# Patient Record
Sex: Male | Born: 1945 | Race: Black or African American | Hispanic: No | Marital: Married
Health system: Southern US, Community
[De-identification: ages and names within clinical notes are randomized; demographics above are authoritative.]

---

## 2011-02-10 DIAGNOSIS — C61 Malignant neoplasm of prostate: Secondary | ICD-10-CM | POA: Insufficient documentation

## 2016-03-17 DIAGNOSIS — H40033 Anatomical narrow angle, bilateral: Secondary | ICD-10-CM | POA: Diagnosis not present

## 2016-03-17 DIAGNOSIS — H2513 Age-related nuclear cataract, bilateral: Secondary | ICD-10-CM | POA: Diagnosis not present

## 2016-03-29 DIAGNOSIS — H25812 Combined forms of age-related cataract, left eye: Secondary | ICD-10-CM | POA: Diagnosis not present

## 2016-03-29 DIAGNOSIS — H25811 Combined forms of age-related cataract, right eye: Secondary | ICD-10-CM | POA: Diagnosis not present

## 2016-03-29 DIAGNOSIS — H353110 Nonexudative age-related macular degeneration, right eye, stage unspecified: Secondary | ICD-10-CM | POA: Diagnosis not present

## 2016-04-04 DIAGNOSIS — H25811 Combined forms of age-related cataract, right eye: Secondary | ICD-10-CM | POA: Diagnosis not present

## 2016-04-04 DIAGNOSIS — H2511 Age-related nuclear cataract, right eye: Secondary | ICD-10-CM | POA: Diagnosis not present

## 2016-04-21 DIAGNOSIS — H25812 Combined forms of age-related cataract, left eye: Secondary | ICD-10-CM | POA: Diagnosis not present

## 2016-04-21 DIAGNOSIS — H2512 Age-related nuclear cataract, left eye: Secondary | ICD-10-CM | POA: Diagnosis not present

## 2016-09-15 DIAGNOSIS — M79671 Pain in right foot: Secondary | ICD-10-CM | POA: Diagnosis not present

## 2016-09-15 DIAGNOSIS — M2021 Hallux rigidus, right foot: Secondary | ICD-10-CM | POA: Diagnosis not present

## 2016-12-29 DIAGNOSIS — M9903 Segmental and somatic dysfunction of lumbar region: Secondary | ICD-10-CM | POA: Diagnosis not present

## 2016-12-29 DIAGNOSIS — S338XXA Sprain of other parts of lumbar spine and pelvis, initial encounter: Secondary | ICD-10-CM | POA: Diagnosis not present

## 2016-12-29 DIAGNOSIS — S8392XA Sprain of unspecified site of left knee, initial encounter: Secondary | ICD-10-CM | POA: Diagnosis not present

## 2017-01-03 DIAGNOSIS — S338XXA Sprain of other parts of lumbar spine and pelvis, initial encounter: Secondary | ICD-10-CM | POA: Diagnosis not present

## 2017-01-03 DIAGNOSIS — S8392XA Sprain of unspecified site of left knee, initial encounter: Secondary | ICD-10-CM | POA: Diagnosis not present

## 2017-01-03 DIAGNOSIS — M9903 Segmental and somatic dysfunction of lumbar region: Secondary | ICD-10-CM | POA: Diagnosis not present

## 2017-01-10 DIAGNOSIS — S338XXA Sprain of other parts of lumbar spine and pelvis, initial encounter: Secondary | ICD-10-CM | POA: Diagnosis not present

## 2017-01-10 DIAGNOSIS — S8392XA Sprain of unspecified site of left knee, initial encounter: Secondary | ICD-10-CM | POA: Diagnosis not present

## 2017-01-10 DIAGNOSIS — M9903 Segmental and somatic dysfunction of lumbar region: Secondary | ICD-10-CM | POA: Diagnosis not present

## 2017-01-17 DIAGNOSIS — S8392XA Sprain of unspecified site of left knee, initial encounter: Secondary | ICD-10-CM | POA: Diagnosis not present

## 2017-01-17 DIAGNOSIS — S338XXA Sprain of other parts of lumbar spine and pelvis, initial encounter: Secondary | ICD-10-CM | POA: Diagnosis not present

## 2017-01-17 DIAGNOSIS — M9903 Segmental and somatic dysfunction of lumbar region: Secondary | ICD-10-CM | POA: Diagnosis not present

## 2017-01-26 DIAGNOSIS — S8392XA Sprain of unspecified site of left knee, initial encounter: Secondary | ICD-10-CM | POA: Diagnosis not present

## 2017-01-26 DIAGNOSIS — M9903 Segmental and somatic dysfunction of lumbar region: Secondary | ICD-10-CM | POA: Diagnosis not present

## 2017-01-26 DIAGNOSIS — S338XXA Sprain of other parts of lumbar spine and pelvis, initial encounter: Secondary | ICD-10-CM | POA: Diagnosis not present

## 2017-02-09 DIAGNOSIS — S8392XA Sprain of unspecified site of left knee, initial encounter: Secondary | ICD-10-CM | POA: Diagnosis not present

## 2017-02-09 DIAGNOSIS — S338XXA Sprain of other parts of lumbar spine and pelvis, initial encounter: Secondary | ICD-10-CM | POA: Diagnosis not present

## 2017-02-09 DIAGNOSIS — M9903 Segmental and somatic dysfunction of lumbar region: Secondary | ICD-10-CM | POA: Diagnosis not present

## 2017-02-23 DIAGNOSIS — S338XXA Sprain of other parts of lumbar spine and pelvis, initial encounter: Secondary | ICD-10-CM | POA: Diagnosis not present

## 2017-02-23 DIAGNOSIS — M9903 Segmental and somatic dysfunction of lumbar region: Secondary | ICD-10-CM | POA: Diagnosis not present

## 2017-02-23 DIAGNOSIS — S8392XA Sprain of unspecified site of left knee, initial encounter: Secondary | ICD-10-CM | POA: Diagnosis not present

## 2017-12-12 DIAGNOSIS — M79675 Pain in left toe(s): Secondary | ICD-10-CM | POA: Diagnosis not present

## 2017-12-12 DIAGNOSIS — M1000 Idiopathic gout, unspecified site: Secondary | ICD-10-CM | POA: Diagnosis not present

## 2018-01-23 DIAGNOSIS — E1165 Type 2 diabetes mellitus with hyperglycemia: Secondary | ICD-10-CM | POA: Diagnosis not present

## 2018-01-23 DIAGNOSIS — J40 Bronchitis, not specified as acute or chronic: Secondary | ICD-10-CM | POA: Diagnosis not present

## 2018-01-23 DIAGNOSIS — J0100 Acute maxillary sinusitis, unspecified: Secondary | ICD-10-CM | POA: Diagnosis not present

## 2018-01-24 DIAGNOSIS — J0100 Acute maxillary sinusitis, unspecified: Secondary | ICD-10-CM | POA: Diagnosis not present

## 2018-01-24 DIAGNOSIS — I10 Essential (primary) hypertension: Secondary | ICD-10-CM | POA: Diagnosis not present

## 2018-01-24 DIAGNOSIS — D351 Benign neoplasm of parathyroid gland: Secondary | ICD-10-CM | POA: Diagnosis not present

## 2018-01-24 DIAGNOSIS — Z87891 Personal history of nicotine dependence: Secondary | ICD-10-CM | POA: Diagnosis not present

## 2018-01-24 DIAGNOSIS — J40 Bronchitis, not specified as acute or chronic: Secondary | ICD-10-CM | POA: Diagnosis not present

## 2018-01-24 DIAGNOSIS — Z7982 Long term (current) use of aspirin: Secondary | ICD-10-CM | POA: Diagnosis not present

## 2018-01-24 DIAGNOSIS — Z23 Encounter for immunization: Secondary | ICD-10-CM | POA: Diagnosis not present

## 2018-01-24 DIAGNOSIS — E1165 Type 2 diabetes mellitus with hyperglycemia: Secondary | ICD-10-CM | POA: Diagnosis not present

## 2018-01-24 DIAGNOSIS — E785 Hyperlipidemia, unspecified: Secondary | ICD-10-CM | POA: Diagnosis not present

## 2018-01-24 DIAGNOSIS — Z79899 Other long term (current) drug therapy: Secondary | ICD-10-CM | POA: Diagnosis not present

## 2018-01-24 DIAGNOSIS — Z7984 Long term (current) use of oral hypoglycemic drugs: Secondary | ICD-10-CM | POA: Diagnosis not present

## 2018-02-01 DIAGNOSIS — Z6830 Body mass index (BMI) 30.0-30.9, adult: Secondary | ICD-10-CM | POA: Diagnosis not present

## 2018-02-01 DIAGNOSIS — E785 Hyperlipidemia, unspecified: Secondary | ICD-10-CM | POA: Diagnosis not present

## 2018-02-01 DIAGNOSIS — E119 Type 2 diabetes mellitus without complications: Secondary | ICD-10-CM | POA: Diagnosis not present

## 2018-02-01 DIAGNOSIS — I10 Essential (primary) hypertension: Secondary | ICD-10-CM | POA: Diagnosis not present

## 2018-02-02 ENCOUNTER — Other Ambulatory Visit: Payer: Self-pay

## 2018-02-02 DIAGNOSIS — Z6830 Body mass index (BMI) 30.0-30.9, adult: Secondary | ICD-10-CM | POA: Diagnosis not present

## 2018-02-02 DIAGNOSIS — I10 Essential (primary) hypertension: Secondary | ICD-10-CM | POA: Diagnosis not present

## 2018-02-02 DIAGNOSIS — E119 Type 2 diabetes mellitus without complications: Secondary | ICD-10-CM | POA: Diagnosis not present

## 2018-02-02 DIAGNOSIS — E785 Hyperlipidemia, unspecified: Secondary | ICD-10-CM | POA: Diagnosis not present

## 2018-02-02 NOTE — Patient Outreach (Signed)
[facility]) Care Management  02/02/2018  Kevin Rivas [birth year] [id number]  Transition of care  Referral date: 02/02/18 Referral source: discharged from an inpatient admission from [facility] on 01/26/18. Insurance: [employer]  Transition of care will be completed by patient's primary care provider office who will refer to [facility] if needed.   PLAN: RNCM will close patient due to patient being enrolled in an external program.   Quinn Plowman RN,BSN,CCM [facility] Telephonic  [phone]

## 2018-02-15 DIAGNOSIS — Z0000 Encounter for general adult medical examination without abnormal findings: Secondary | ICD-10-CM | POA: Diagnosis not present

## 2018-02-15 DIAGNOSIS — Z6830 Body mass index (BMI) 30.0-30.9, adult: Secondary | ICD-10-CM | POA: Diagnosis not present

## 2018-02-19 DIAGNOSIS — E041 Nontoxic single thyroid nodule: Secondary | ICD-10-CM | POA: Diagnosis not present

## 2018-02-23 DIAGNOSIS — E210 Primary hyperparathyroidism: Secondary | ICD-10-CM | POA: Diagnosis not present

## 2018-02-23 DIAGNOSIS — E213 Hyperparathyroidism, unspecified: Secondary | ICD-10-CM | POA: Diagnosis not present

## 2018-03-06 DIAGNOSIS — E1165 Type 2 diabetes mellitus with hyperglycemia: Secondary | ICD-10-CM | POA: Insufficient documentation

## 2018-03-06 DIAGNOSIS — E119 Type 2 diabetes mellitus without complications: Secondary | ICD-10-CM | POA: Diagnosis not present

## 2018-03-06 DIAGNOSIS — R000 Tachycardia, unspecified: Secondary | ICD-10-CM | POA: Diagnosis not present

## 2018-03-06 DIAGNOSIS — E210 Primary hyperparathyroidism: Secondary | ICD-10-CM | POA: Diagnosis not present

## 2018-03-06 DIAGNOSIS — Z794 Long term (current) use of insulin: Secondary | ICD-10-CM | POA: Diagnosis not present

## 2018-03-12 DIAGNOSIS — E210 Primary hyperparathyroidism: Secondary | ICD-10-CM | POA: Diagnosis not present

## 2018-03-12 DIAGNOSIS — E212 Other hyperparathyroidism: Secondary | ICD-10-CM | POA: Diagnosis not present

## 2018-03-12 DIAGNOSIS — E213 Hyperparathyroidism, unspecified: Secondary | ICD-10-CM | POA: Diagnosis not present

## 2018-03-30 DIAGNOSIS — E210 Primary hyperparathyroidism: Secondary | ICD-10-CM | POA: Diagnosis not present

## 2018-05-17 DIAGNOSIS — E785 Hyperlipidemia, unspecified: Secondary | ICD-10-CM | POA: Diagnosis not present

## 2018-05-17 DIAGNOSIS — R634 Abnormal weight loss: Secondary | ICD-10-CM | POA: Diagnosis not present

## 2018-05-17 DIAGNOSIS — H6690 Otitis media, unspecified, unspecified ear: Secondary | ICD-10-CM | POA: Diagnosis not present

## 2018-05-17 DIAGNOSIS — B029 Zoster without complications: Secondary | ICD-10-CM | POA: Diagnosis not present

## 2018-05-17 DIAGNOSIS — E782 Mixed hyperlipidemia: Secondary | ICD-10-CM | POA: Diagnosis not present

## 2018-05-17 DIAGNOSIS — Z09 Encounter for follow-up examination after completed treatment for conditions other than malignant neoplasm: Secondary | ICD-10-CM | POA: Diagnosis not present

## 2018-05-17 DIAGNOSIS — I10 Essential (primary) hypertension: Secondary | ICD-10-CM | POA: Diagnosis not present

## 2018-05-17 DIAGNOSIS — R7309 Other abnormal glucose: Secondary | ICD-10-CM | POA: Diagnosis not present

## 2018-05-17 DIAGNOSIS — Z1389 Encounter for screening for other disorder: Secondary | ICD-10-CM | POA: Diagnosis not present

## 2018-05-17 DIAGNOSIS — Z6831 Body mass index (BMI) 31.0-31.9, adult: Secondary | ICD-10-CM | POA: Diagnosis not present

## 2018-05-17 DIAGNOSIS — L720 Epidermal cyst: Secondary | ICD-10-CM | POA: Diagnosis not present

## 2018-05-17 DIAGNOSIS — E1121 Type 2 diabetes mellitus with diabetic nephropathy: Secondary | ICD-10-CM | POA: Diagnosis not present

## 2018-05-17 DIAGNOSIS — J302 Other seasonal allergic rhinitis: Secondary | ICD-10-CM | POA: Diagnosis not present

## 2018-05-17 DIAGNOSIS — I2510 Atherosclerotic heart disease of native coronary artery without angina pectoris: Secondary | ICD-10-CM | POA: Diagnosis not present

## 2018-05-17 DIAGNOSIS — Z85828 Personal history of other malignant neoplasm of skin: Secondary | ICD-10-CM | POA: Diagnosis not present

## 2018-05-17 DIAGNOSIS — Z6832 Body mass index (BMI) 32.0-32.9, adult: Secondary | ICD-10-CM | POA: Diagnosis not present

## 2018-05-17 DIAGNOSIS — L820 Inflamed seborrheic keratosis: Secondary | ICD-10-CM | POA: Diagnosis not present

## 2018-05-17 DIAGNOSIS — Z0000 Encounter for general adult medical examination without abnormal findings: Secondary | ICD-10-CM | POA: Diagnosis not present

## 2018-05-17 DIAGNOSIS — E559 Vitamin D deficiency, unspecified: Secondary | ICD-10-CM | POA: Diagnosis not present

## 2018-05-17 DIAGNOSIS — L821 Other seborrheic keratosis: Secondary | ICD-10-CM | POA: Diagnosis not present

## 2018-05-17 DIAGNOSIS — R52 Pain, unspecified: Secondary | ICD-10-CM | POA: Diagnosis not present

## 2018-05-17 DIAGNOSIS — E063 Autoimmune thyroiditis: Secondary | ICD-10-CM | POA: Diagnosis not present

## 2018-05-17 DIAGNOSIS — Z23 Encounter for immunization: Secondary | ICD-10-CM | POA: Diagnosis not present

## 2018-05-17 DIAGNOSIS — L738 Other specified follicular disorders: Secondary | ICD-10-CM | POA: Diagnosis not present

## 2018-05-17 DIAGNOSIS — Z6830 Body mass index (BMI) 30.0-30.9, adult: Secondary | ICD-10-CM | POA: Diagnosis not present

## 2018-05-17 DIAGNOSIS — M5136 Other intervertebral disc degeneration, lumbar region: Secondary | ICD-10-CM | POA: Diagnosis not present

## 2018-05-17 DIAGNOSIS — N182 Chronic kidney disease, stage 2 (mild): Secondary | ICD-10-CM | POA: Diagnosis not present

## 2018-06-04 DIAGNOSIS — M9903 Segmental and somatic dysfunction of lumbar region: Secondary | ICD-10-CM | POA: Diagnosis not present

## 2018-06-04 DIAGNOSIS — S8392XA Sprain of unspecified site of left knee, initial encounter: Secondary | ICD-10-CM | POA: Diagnosis not present

## 2018-06-04 DIAGNOSIS — S338XXA Sprain of other parts of lumbar spine and pelvis, initial encounter: Secondary | ICD-10-CM | POA: Diagnosis not present

## 2018-06-05 DIAGNOSIS — S338XXA Sprain of other parts of lumbar spine and pelvis, initial encounter: Secondary | ICD-10-CM | POA: Diagnosis not present

## 2018-06-05 DIAGNOSIS — S8392XA Sprain of unspecified site of left knee, initial encounter: Secondary | ICD-10-CM | POA: Diagnosis not present

## 2018-06-05 DIAGNOSIS — M9903 Segmental and somatic dysfunction of lumbar region: Secondary | ICD-10-CM | POA: Diagnosis not present

## 2018-07-07 DIAGNOSIS — D649 Anemia, unspecified: Secondary | ICD-10-CM | POA: Diagnosis not present

## 2018-07-07 DIAGNOSIS — E1165 Type 2 diabetes mellitus with hyperglycemia: Secondary | ICD-10-CM | POA: Diagnosis not present

## 2018-07-07 DIAGNOSIS — K7689 Other specified diseases of liver: Secondary | ICD-10-CM | POA: Diagnosis not present

## 2018-07-07 DIAGNOSIS — E871 Hypo-osmolality and hyponatremia: Secondary | ICD-10-CM | POA: Diagnosis not present

## 2018-07-07 DIAGNOSIS — E785 Hyperlipidemia, unspecified: Secondary | ICD-10-CM | POA: Diagnosis not present

## 2018-07-07 DIAGNOSIS — R42 Dizziness and giddiness: Secondary | ICD-10-CM | POA: Diagnosis not present

## 2018-07-07 DIAGNOSIS — E876 Hypokalemia: Secondary | ICD-10-CM | POA: Diagnosis not present

## 2018-07-07 DIAGNOSIS — Z87891 Personal history of nicotine dependence: Secondary | ICD-10-CM | POA: Diagnosis not present

## 2018-07-07 DIAGNOSIS — R61 Generalized hyperhidrosis: Secondary | ICD-10-CM | POA: Diagnosis not present

## 2018-07-07 DIAGNOSIS — R531 Weakness: Secondary | ICD-10-CM | POA: Diagnosis not present

## 2018-07-07 DIAGNOSIS — K8689 Other specified diseases of pancreas: Secondary | ICD-10-CM | POA: Diagnosis not present

## 2018-07-07 DIAGNOSIS — R110 Nausea: Secondary | ICD-10-CM | POA: Diagnosis not present

## 2018-07-07 DIAGNOSIS — Z7982 Long term (current) use of aspirin: Secondary | ICD-10-CM | POA: Diagnosis not present

## 2018-07-07 DIAGNOSIS — I10 Essential (primary) hypertension: Secondary | ICD-10-CM | POA: Diagnosis not present

## 2018-07-07 DIAGNOSIS — K869 Disease of pancreas, unspecified: Secondary | ICD-10-CM | POA: Diagnosis not present

## 2018-07-07 DIAGNOSIS — N2889 Other specified disorders of kidney and ureter: Secondary | ICD-10-CM | POA: Diagnosis not present

## 2018-07-07 DIAGNOSIS — Z794 Long term (current) use of insulin: Secondary | ICD-10-CM | POA: Diagnosis not present

## 2018-07-07 DIAGNOSIS — K838 Other specified diseases of biliary tract: Secondary | ICD-10-CM | POA: Diagnosis not present

## 2018-07-07 DIAGNOSIS — Z923 Personal history of irradiation: Secondary | ICD-10-CM | POA: Diagnosis not present

## 2018-07-11 DIAGNOSIS — E1165 Type 2 diabetes mellitus with hyperglycemia: Secondary | ICD-10-CM | POA: Diagnosis not present

## 2018-07-11 DIAGNOSIS — K838 Other specified diseases of biliary tract: Secondary | ICD-10-CM | POA: Diagnosis not present

## 2018-07-11 DIAGNOSIS — C250 Malignant neoplasm of head of pancreas: Secondary | ICD-10-CM | POA: Diagnosis not present

## 2018-07-11 DIAGNOSIS — Z7982 Long term (current) use of aspirin: Secondary | ICD-10-CM | POA: Diagnosis not present

## 2018-07-11 DIAGNOSIS — Z20828 Contact with and (suspected) exposure to other viral communicable diseases: Secondary | ICD-10-CM | POA: Diagnosis not present

## 2018-07-11 DIAGNOSIS — M109 Gout, unspecified: Secondary | ICD-10-CM | POA: Diagnosis not present

## 2018-07-11 DIAGNOSIS — D649 Anemia, unspecified: Secondary | ICD-10-CM | POA: Diagnosis not present

## 2018-07-11 DIAGNOSIS — E871 Hypo-osmolality and hyponatremia: Secondary | ICD-10-CM | POA: Diagnosis not present

## 2018-07-11 DIAGNOSIS — Z451 Encounter for adjustment and management of infusion pump: Secondary | ICD-10-CM | POA: Diagnosis not present

## 2018-07-11 DIAGNOSIS — E876 Hypokalemia: Secondary | ICD-10-CM | POA: Diagnosis not present

## 2018-07-11 DIAGNOSIS — K8689 Other specified diseases of pancreas: Secondary | ICD-10-CM | POA: Insufficient documentation

## 2018-07-11 DIAGNOSIS — E119 Type 2 diabetes mellitus without complications: Secondary | ICD-10-CM | POA: Diagnosis not present

## 2018-07-11 DIAGNOSIS — D490 Neoplasm of unspecified behavior of digestive system: Secondary | ICD-10-CM | POA: Diagnosis not present

## 2018-07-11 DIAGNOSIS — R17 Unspecified jaundice: Secondary | ICD-10-CM | POA: Diagnosis not present

## 2018-07-11 DIAGNOSIS — E213 Hyperparathyroidism, unspecified: Secondary | ICD-10-CM | POA: Diagnosis not present

## 2018-07-11 DIAGNOSIS — I509 Heart failure, unspecified: Secondary | ICD-10-CM | POA: Diagnosis not present

## 2018-07-11 DIAGNOSIS — Z79899 Other long term (current) drug therapy: Secondary | ICD-10-CM | POA: Diagnosis not present

## 2018-07-11 DIAGNOSIS — K869 Disease of pancreas, unspecified: Secondary | ICD-10-CM | POA: Diagnosis not present

## 2018-07-11 DIAGNOSIS — K269 Duodenal ulcer, unspecified as acute or chronic, without hemorrhage or perforation: Secondary | ICD-10-CM | POA: Diagnosis not present

## 2018-07-11 DIAGNOSIS — Z794 Long term (current) use of insulin: Secondary | ICD-10-CM | POA: Diagnosis not present

## 2018-07-11 DIAGNOSIS — M6250 Muscle wasting and atrophy, not elsewhere classified, unspecified site: Secondary | ICD-10-CM | POA: Diagnosis not present

## 2018-07-11 DIAGNOSIS — C259 Malignant neoplasm of pancreas, unspecified: Secondary | ICD-10-CM | POA: Diagnosis not present

## 2018-07-11 DIAGNOSIS — Z8546 Personal history of malignant neoplasm of prostate: Secondary | ICD-10-CM | POA: Diagnosis not present

## 2018-07-11 DIAGNOSIS — I10 Essential (primary) hypertension: Secondary | ICD-10-CM | POA: Diagnosis not present

## 2018-07-11 DIAGNOSIS — Z87891 Personal history of nicotine dependence: Secondary | ICD-10-CM | POA: Diagnosis not present

## 2018-07-11 DIAGNOSIS — K831 Obstruction of bile duct: Secondary | ICD-10-CM | POA: Diagnosis not present

## 2018-07-23 DIAGNOSIS — Z6827 Body mass index (BMI) 27.0-27.9, adult: Secondary | ICD-10-CM | POA: Diagnosis not present

## 2018-07-23 DIAGNOSIS — D017 Carcinoma in situ of other specified digestive organs: Secondary | ICD-10-CM | POA: Diagnosis not present

## 2018-07-25 DIAGNOSIS — C259 Malignant neoplasm of pancreas, unspecified: Secondary | ICD-10-CM | POA: Diagnosis not present

## 2018-07-25 DIAGNOSIS — C772 Secondary and unspecified malignant neoplasm of intra-abdominal lymph nodes: Secondary | ICD-10-CM | POA: Diagnosis not present

## 2018-07-25 DIAGNOSIS — K8689 Other specified diseases of pancreas: Secondary | ICD-10-CM | POA: Diagnosis not present

## 2018-07-25 DIAGNOSIS — C250 Malignant neoplasm of head of pancreas: Secondary | ICD-10-CM | POA: Diagnosis not present

## 2018-07-25 DIAGNOSIS — C61 Malignant neoplasm of prostate: Secondary | ICD-10-CM | POA: Diagnosis not present

## 2018-07-26 ENCOUNTER — Other Ambulatory Visit: Payer: Self-pay | Admitting: Oncology

## 2018-07-26 ENCOUNTER — Other Ambulatory Visit (HOSPITAL_COMMUNITY): Payer: Self-pay | Admitting: Oncology

## 2018-07-26 DIAGNOSIS — C250 Malignant neoplasm of head of pancreas: Secondary | ICD-10-CM

## 2018-07-31 ENCOUNTER — Ambulatory Visit (HOSPITAL_COMMUNITY): Payer: PPO

## 2018-08-01 DIAGNOSIS — C259 Malignant neoplasm of pancreas, unspecified: Secondary | ICD-10-CM | POA: Insufficient documentation

## 2018-08-03 ENCOUNTER — Encounter (HOSPITAL_COMMUNITY)
Admission: RE | Admit: 2018-08-03 | Discharge: 2018-08-03 | Disposition: A | Discharge status: Home / Self Care | Payer: PPO | Source: Ambulatory Visit | Attending: Oncology | Admitting: Oncology

## 2018-08-03 ENCOUNTER — Encounter (HOSPITAL_COMMUNITY): Payer: PPO

## 2018-08-03 ENCOUNTER — Other Ambulatory Visit: Payer: Self-pay

## 2018-08-03 DIAGNOSIS — C250 Malignant neoplasm of head of pancreas: Secondary | ICD-10-CM | POA: Diagnosis not present

## 2018-08-03 DIAGNOSIS — J320 Chronic maxillary sinusitis: Secondary | ICD-10-CM | POA: Diagnosis not present

## 2018-08-03 DIAGNOSIS — C259 Malignant neoplasm of pancreas, unspecified: Secondary | ICD-10-CM | POA: Diagnosis not present

## 2018-08-03 LAB — GLUCOSE, CAPILLARY: Glucose-Capillary: 178 mg/dL — ABNORMAL HIGH (ref 70–99)

## 2018-08-03 IMAGING — PT NUCLEAR MEDICINE PET IMAGE INITIAL (PI) SKULL BASE TO THIGH
1 series · 8 of 8 positions shown · non-contrast
Comparison: CT abdomen 07/07/2018

CLINICAL DATA: Initial treatment strategy for pancreatic cancer.

EXAM:
NUCLEAR MEDICINE PET SKULL BASE TO THIGH
TECHNIQUE: 9.9 mCi F-18 FDG was injected intravenously. Full-ring PET imaging
was performed from the skull base to thigh after the radiotracer. CT
data was obtained and used for attenuation correction and anatomic
localization.
Fasting blood glucose: 178 mg/dl

[Series 1077: results mm oncology reading · 5.0mm · 0.75mm/px · 8 of 8 slices shown]
[im 1/8]
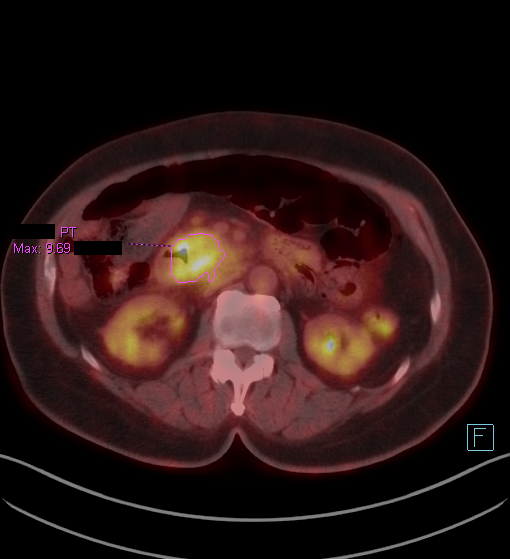
[im 2/8]
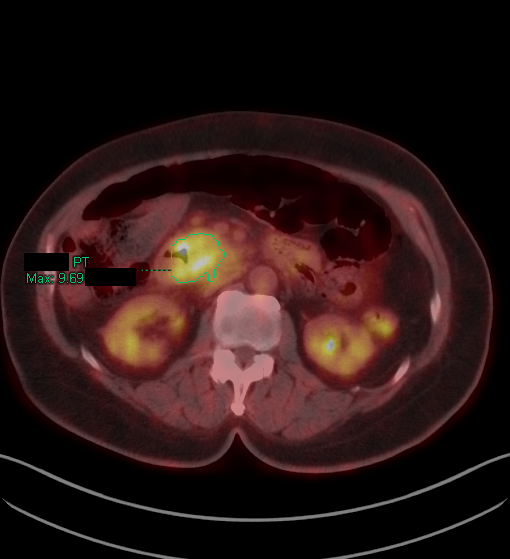
[im 3/8]
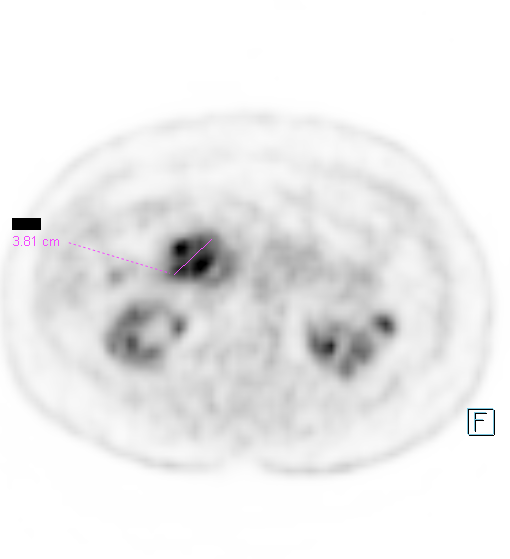
[im 4/8]
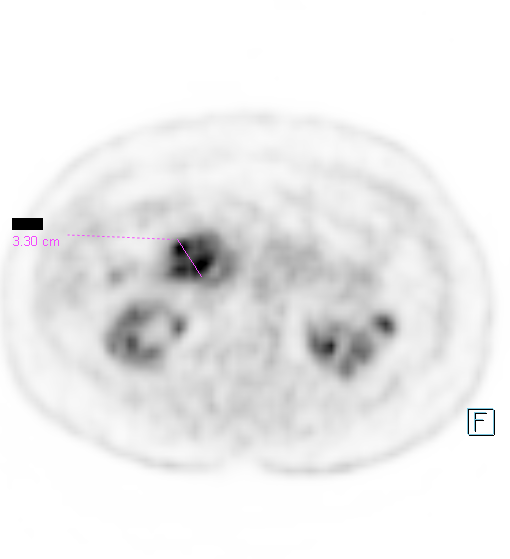
[im 5/8]
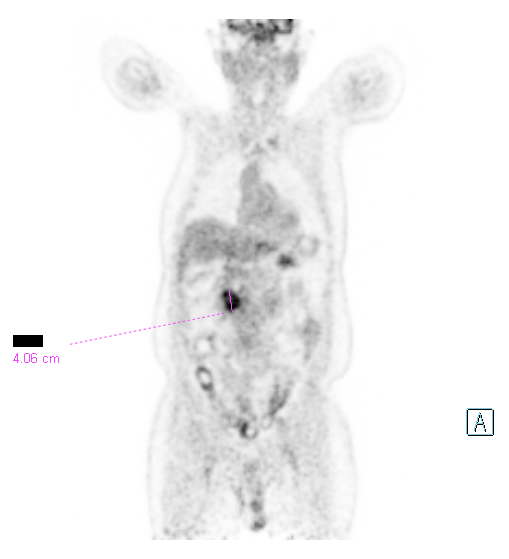
[im 6/8]
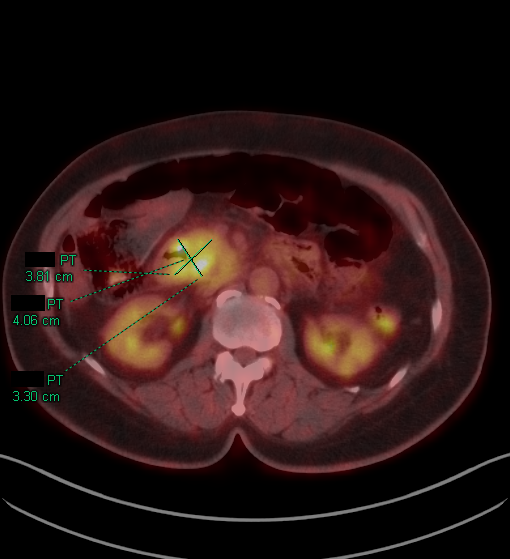
[im 7/8]
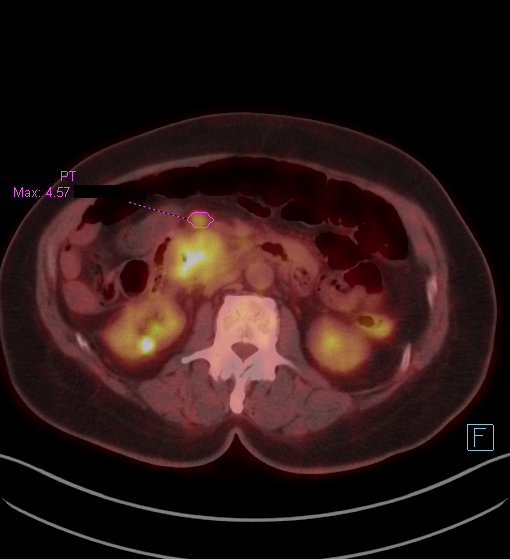
[im 8/8]
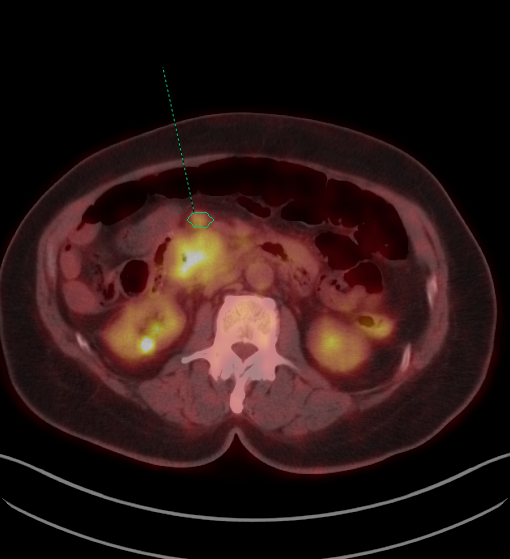

[8 of 8 positions shown; findings below may reference images not displayed]

FINDINGS: Mediastinal blood pool activity: SUV max

Liver activity: SUV max NA

NECK: No significant abnormal hypermetabolic activity in this
region.

Incidental CT findings: Chronic left maxillary sinusitis. Partial
right thyroidectomy.

CHEST: No significant abnormal hypermetabolic activity in this
region.

Incidental CT findings: none

ABDOMEN/PELVIS: Pancreatic head mass with maximum SUV 9.7,
compatible with malignancy. Associated hypermetabolic activity
measures 4.1 by 3.8 by 3.3 cm. A peripancreatic lymph node with
short axis diameter 1.3 cm just anterior to the mass on image 136/4
has a maximum SUV of 4.6, favoring malignant involvement. The mass
has somewhat indistinct borders, and there is prominent pancreatic
duct dilatation extending into the vicinity of the mass

Accentuated metabolic activity in various loops of bowel, primarily
colon, multifocal in segmental, most likely physiologic given the
lack of correlating CT findings. No hepatic metastatic lesions are
identified.

Incidental CT findings: There is a stent passing from the gastric
lumen through the left hepatic lobe and terminating in a pigtail in
the porta hepatis, probably a hepatico-gastric stent, correlate with
recent procedural interventions. The biliary dilatation is markedly
improved.

A stable 0.9 by 0.8 cm hypodense lesion in the liver in the right
hepatic lobe on image 110/4 is not hypermetabolic and appear stable.

Mild peripancreatic stranding, a degree of pancreatitis is difficult
to exclude.

Prior prostatectomy and pelvic lymph node dissection. There is wall
thickening in the rectum which may be from nondistention but which
is technically nonspecific.

SKELETON: No significant abnormal hypermetabolic activity in this
region.

Incidental CT findings: Bridging spurring of the sacroiliac joints.
Degenerative right facet arthropathy at L5-S1. Asymmetric
degenerative glenohumeral arthropathy, right greater than left.
IMPRESSION: 1. Hypermetabolic pancreatic head mass, maximum SUV 9.7, measuring
up to 4.1 cm in long axis. Marked dilatation of the dorsal
pancreatic duct extending to this mass. At least 1 adjacent
hypermetabolic lymph node anterior to the mass, maximum SUV 4.6. g
2. No distant metastatic spread is identified.
3. Hepatico-gastric stent with resulting interval decompression of
the biliary tree.
4. Other imaging findings of potential clinical significance:
Chronic left maxillary sinusitis. Prior prostatectomy. Wall
thickening of the rectum, possibly from nondistention but
technically nonspecific, without accentuated metabolic activity.

## 2018-08-03 MED ORDER — FLUDEOXYGLUCOSE F - 18 (FDG) INJECTION
9.9000 | Freq: Once | INTRAVENOUS | Status: AC | PRN
  Administered 2018-08-03: 9.9 via INTRAVENOUS

## 2018-08-07 DIAGNOSIS — C259 Malignant neoplasm of pancreas, unspecified: Secondary | ICD-10-CM | POA: Diagnosis not present

## 2018-08-07 DIAGNOSIS — D649 Anemia, unspecified: Secondary | ICD-10-CM | POA: Diagnosis not present

## 2018-08-07 DIAGNOSIS — C61 Malignant neoplasm of prostate: Secondary | ICD-10-CM | POA: Diagnosis not present

## 2018-08-07 DIAGNOSIS — C772 Secondary and unspecified malignant neoplasm of intra-abdominal lymph nodes: Secondary | ICD-10-CM | POA: Diagnosis not present

## 2018-08-07 DIAGNOSIS — R17 Unspecified jaundice: Secondary | ICD-10-CM | POA: Diagnosis not present

## 2018-08-13 DIAGNOSIS — Z794 Long term (current) use of insulin: Secondary | ICD-10-CM | POA: Diagnosis not present

## 2018-08-13 DIAGNOSIS — Z1159 Encounter for screening for other viral diseases: Secondary | ICD-10-CM | POA: Diagnosis not present

## 2018-08-13 DIAGNOSIS — E876 Hypokalemia: Secondary | ICD-10-CM | POA: Diagnosis not present

## 2018-08-13 DIAGNOSIS — Z79899 Other long term (current) drug therapy: Secondary | ICD-10-CM | POA: Diagnosis not present

## 2018-08-13 DIAGNOSIS — Z7982 Long term (current) use of aspirin: Secondary | ICD-10-CM | POA: Diagnosis not present

## 2018-08-13 DIAGNOSIS — Z8546 Personal history of malignant neoplasm of prostate: Secondary | ICD-10-CM | POA: Diagnosis not present

## 2018-08-13 DIAGNOSIS — Z87891 Personal history of nicotine dependence: Secondary | ICD-10-CM | POA: Diagnosis not present

## 2018-08-13 DIAGNOSIS — E119 Type 2 diabetes mellitus without complications: Secondary | ICD-10-CM | POA: Diagnosis not present

## 2018-08-13 DIAGNOSIS — C772 Secondary and unspecified malignant neoplasm of intra-abdominal lymph nodes: Secondary | ICD-10-CM | POA: Diagnosis not present

## 2018-08-13 DIAGNOSIS — E871 Hypo-osmolality and hyponatremia: Secondary | ICD-10-CM | POA: Diagnosis not present

## 2018-08-13 DIAGNOSIS — C259 Malignant neoplasm of pancreas, unspecified: Secondary | ICD-10-CM | POA: Diagnosis not present

## 2018-08-13 DIAGNOSIS — I10 Essential (primary) hypertension: Secondary | ICD-10-CM | POA: Diagnosis not present

## 2018-08-15 DIAGNOSIS — E876 Hypokalemia: Secondary | ICD-10-CM | POA: Diagnosis not present

## 2018-08-15 DIAGNOSIS — Z87891 Personal history of nicotine dependence: Secondary | ICD-10-CM | POA: Diagnosis not present

## 2018-08-15 DIAGNOSIS — E871 Hypo-osmolality and hyponatremia: Secondary | ICD-10-CM | POA: Diagnosis not present

## 2018-08-15 DIAGNOSIS — C772 Secondary and unspecified malignant neoplasm of intra-abdominal lymph nodes: Secondary | ICD-10-CM | POA: Diagnosis not present

## 2018-08-15 DIAGNOSIS — I10 Essential (primary) hypertension: Secondary | ICD-10-CM | POA: Diagnosis not present

## 2018-08-15 DIAGNOSIS — Z794 Long term (current) use of insulin: Secondary | ICD-10-CM | POA: Diagnosis not present

## 2018-08-15 DIAGNOSIS — E119 Type 2 diabetes mellitus without complications: Secondary | ICD-10-CM | POA: Diagnosis not present

## 2018-08-15 DIAGNOSIS — C259 Malignant neoplasm of pancreas, unspecified: Secondary | ICD-10-CM | POA: Diagnosis not present

## 2018-08-15 DIAGNOSIS — Z79899 Other long term (current) drug therapy: Secondary | ICD-10-CM | POA: Diagnosis not present

## 2018-08-15 DIAGNOSIS — Z7982 Long term (current) use of aspirin: Secondary | ICD-10-CM | POA: Diagnosis not present

## 2018-08-16 DIAGNOSIS — C259 Malignant neoplasm of pancreas, unspecified: Secondary | ICD-10-CM | POA: Diagnosis not present

## 2018-08-16 DIAGNOSIS — E213 Hyperparathyroidism, unspecified: Secondary | ICD-10-CM | POA: Diagnosis not present

## 2018-08-16 DIAGNOSIS — Z7982 Long term (current) use of aspirin: Secondary | ICD-10-CM | POA: Diagnosis not present

## 2018-08-16 DIAGNOSIS — Z87891 Personal history of nicotine dependence: Secondary | ICD-10-CM | POA: Diagnosis not present

## 2018-08-16 DIAGNOSIS — Z794 Long term (current) use of insulin: Secondary | ICD-10-CM | POA: Diagnosis not present

## 2018-08-16 DIAGNOSIS — Z79899 Other long term (current) drug therapy: Secondary | ICD-10-CM | POA: Diagnosis not present

## 2018-08-16 DIAGNOSIS — Z923 Personal history of irradiation: Secondary | ICD-10-CM | POA: Diagnosis not present

## 2018-08-16 DIAGNOSIS — E1165 Type 2 diabetes mellitus with hyperglycemia: Secondary | ICD-10-CM | POA: Diagnosis not present

## 2018-08-16 DIAGNOSIS — E871 Hypo-osmolality and hyponatremia: Secondary | ICD-10-CM | POA: Diagnosis not present

## 2018-08-16 DIAGNOSIS — C772 Secondary and unspecified malignant neoplasm of intra-abdominal lymph nodes: Secondary | ICD-10-CM | POA: Diagnosis not present

## 2018-08-21 DIAGNOSIS — C259 Malignant neoplasm of pancreas, unspecified: Secondary | ICD-10-CM | POA: Diagnosis not present

## 2018-08-21 DIAGNOSIS — C772 Secondary and unspecified malignant neoplasm of intra-abdominal lymph nodes: Secondary | ICD-10-CM | POA: Diagnosis not present

## 2018-08-22 DIAGNOSIS — Z5111 Encounter for antineoplastic chemotherapy: Secondary | ICD-10-CM | POA: Diagnosis not present

## 2018-08-22 DIAGNOSIS — C259 Malignant neoplasm of pancreas, unspecified: Secondary | ICD-10-CM | POA: Diagnosis not present

## 2018-08-22 DIAGNOSIS — C772 Secondary and unspecified malignant neoplasm of intra-abdominal lymph nodes: Secondary | ICD-10-CM | POA: Diagnosis not present

## 2018-08-22 DIAGNOSIS — D649 Anemia, unspecified: Secondary | ICD-10-CM | POA: Diagnosis not present

## 2018-08-24 DIAGNOSIS — C772 Secondary and unspecified malignant neoplasm of intra-abdominal lymph nodes: Secondary | ICD-10-CM | POA: Diagnosis not present

## 2018-08-24 DIAGNOSIS — C259 Malignant neoplasm of pancreas, unspecified: Secondary | ICD-10-CM | POA: Diagnosis not present

## 2018-08-27 DIAGNOSIS — C772 Secondary and unspecified malignant neoplasm of intra-abdominal lymph nodes: Secondary | ICD-10-CM | POA: Diagnosis not present

## 2018-08-27 DIAGNOSIS — C259 Malignant neoplasm of pancreas, unspecified: Secondary | ICD-10-CM | POA: Diagnosis not present

## 2018-09-03 DIAGNOSIS — C772 Secondary and unspecified malignant neoplasm of intra-abdominal lymph nodes: Secondary | ICD-10-CM | POA: Diagnosis not present

## 2018-09-03 DIAGNOSIS — E860 Dehydration: Secondary | ICD-10-CM | POA: Diagnosis not present

## 2018-09-03 DIAGNOSIS — C259 Malignant neoplasm of pancreas, unspecified: Secondary | ICD-10-CM | POA: Diagnosis not present

## 2018-09-03 DIAGNOSIS — D649 Anemia, unspecified: Secondary | ICD-10-CM | POA: Diagnosis not present

## 2018-09-04 DIAGNOSIS — K8689 Other specified diseases of pancreas: Secondary | ICD-10-CM | POA: Diagnosis not present

## 2018-09-04 DIAGNOSIS — C772 Secondary and unspecified malignant neoplasm of intra-abdominal lymph nodes: Secondary | ICD-10-CM | POA: Diagnosis not present

## 2018-09-04 DIAGNOSIS — C259 Malignant neoplasm of pancreas, unspecified: Secondary | ICD-10-CM | POA: Diagnosis not present

## 2018-09-05 DIAGNOSIS — C259 Malignant neoplasm of pancreas, unspecified: Secondary | ICD-10-CM | POA: Diagnosis not present

## 2018-09-05 DIAGNOSIS — C772 Secondary and unspecified malignant neoplasm of intra-abdominal lymph nodes: Secondary | ICD-10-CM | POA: Diagnosis not present

## 2018-09-10 DIAGNOSIS — C772 Secondary and unspecified malignant neoplasm of intra-abdominal lymph nodes: Secondary | ICD-10-CM | POA: Diagnosis not present

## 2018-09-10 DIAGNOSIS — E876 Hypokalemia: Secondary | ICD-10-CM | POA: Diagnosis not present

## 2018-09-10 DIAGNOSIS — E878 Other disorders of electrolyte and fluid balance, not elsewhere classified: Secondary | ICD-10-CM | POA: Diagnosis not present

## 2018-09-10 DIAGNOSIS — D649 Anemia, unspecified: Secondary | ICD-10-CM | POA: Diagnosis not present

## 2018-09-10 DIAGNOSIS — C259 Malignant neoplasm of pancreas, unspecified: Secondary | ICD-10-CM | POA: Diagnosis not present

## 2018-09-10 DIAGNOSIS — E539 Vitamin B deficiency, unspecified: Secondary | ICD-10-CM | POA: Diagnosis not present

## 2018-09-10 DIAGNOSIS — R531 Weakness: Secondary | ICD-10-CM | POA: Diagnosis not present

## 2018-09-11 DIAGNOSIS — C259 Malignant neoplasm of pancreas, unspecified: Secondary | ICD-10-CM | POA: Diagnosis not present

## 2018-09-11 DIAGNOSIS — K8689 Other specified diseases of pancreas: Secondary | ICD-10-CM | POA: Diagnosis not present

## 2018-09-11 DIAGNOSIS — C772 Secondary and unspecified malignant neoplasm of intra-abdominal lymph nodes: Secondary | ICD-10-CM | POA: Diagnosis not present

## 2018-09-12 ENCOUNTER — Other Ambulatory Visit: Payer: Self-pay

## 2018-09-14 DIAGNOSIS — C772 Secondary and unspecified malignant neoplasm of intra-abdominal lymph nodes: Secondary | ICD-10-CM | POA: Diagnosis not present

## 2018-09-14 DIAGNOSIS — C259 Malignant neoplasm of pancreas, unspecified: Secondary | ICD-10-CM | POA: Diagnosis not present

## 2018-09-17 DIAGNOSIS — C772 Secondary and unspecified malignant neoplasm of intra-abdominal lymph nodes: Secondary | ICD-10-CM | POA: Diagnosis not present

## 2018-09-17 DIAGNOSIS — C259 Malignant neoplasm of pancreas, unspecified: Secondary | ICD-10-CM | POA: Diagnosis not present

## 2018-09-18 DIAGNOSIS — T451X5A Adverse effect of antineoplastic and immunosuppressive drugs, initial encounter: Secondary | ICD-10-CM | POA: Diagnosis not present

## 2018-09-18 DIAGNOSIS — C259 Malignant neoplasm of pancreas, unspecified: Secondary | ICD-10-CM | POA: Diagnosis not present

## 2018-09-18 DIAGNOSIS — K521 Toxic gastroenteritis and colitis: Secondary | ICD-10-CM | POA: Diagnosis not present

## 2018-09-18 DIAGNOSIS — C772 Secondary and unspecified malignant neoplasm of intra-abdominal lymph nodes: Secondary | ICD-10-CM | POA: Diagnosis not present

## 2018-09-18 DIAGNOSIS — Z09 Encounter for follow-up examination after completed treatment for conditions other than malignant neoplasm: Secondary | ICD-10-CM | POA: Diagnosis not present

## 2018-09-19 DIAGNOSIS — C772 Secondary and unspecified malignant neoplasm of intra-abdominal lymph nodes: Secondary | ICD-10-CM | POA: Diagnosis not present

## 2018-09-19 DIAGNOSIS — Z5111 Encounter for antineoplastic chemotherapy: Secondary | ICD-10-CM | POA: Diagnosis not present

## 2018-09-19 DIAGNOSIS — C259 Malignant neoplasm of pancreas, unspecified: Secondary | ICD-10-CM | POA: Diagnosis not present

## 2018-09-21 DIAGNOSIS — C259 Malignant neoplasm of pancreas, unspecified: Secondary | ICD-10-CM | POA: Diagnosis not present

## 2018-09-21 DIAGNOSIS — C772 Secondary and unspecified malignant neoplasm of intra-abdominal lymph nodes: Secondary | ICD-10-CM | POA: Diagnosis not present

## 2018-09-25 DIAGNOSIS — T451X5A Adverse effect of antineoplastic and immunosuppressive drugs, initial encounter: Secondary | ICD-10-CM | POA: Diagnosis not present

## 2018-09-25 DIAGNOSIS — C750 Malignant neoplasm of parathyroid gland: Secondary | ICD-10-CM | POA: Diagnosis not present

## 2018-09-25 DIAGNOSIS — C772 Secondary and unspecified malignant neoplasm of intra-abdominal lymph nodes: Secondary | ICD-10-CM | POA: Diagnosis not present

## 2018-09-25 DIAGNOSIS — Z09 Encounter for follow-up examination after completed treatment for conditions other than malignant neoplasm: Secondary | ICD-10-CM | POA: Diagnosis not present

## 2018-09-25 DIAGNOSIS — I10 Essential (primary) hypertension: Secondary | ICD-10-CM | POA: Diagnosis not present

## 2018-09-25 DIAGNOSIS — K521 Toxic gastroenteritis and colitis: Secondary | ICD-10-CM | POA: Diagnosis not present

## 2018-09-25 DIAGNOSIS — Z87891 Personal history of nicotine dependence: Secondary | ICD-10-CM | POA: Diagnosis not present

## 2018-09-25 DIAGNOSIS — Z794 Long term (current) use of insulin: Secondary | ICD-10-CM | POA: Diagnosis not present

## 2018-09-25 DIAGNOSIS — E119 Type 2 diabetes mellitus without complications: Secondary | ICD-10-CM | POA: Diagnosis not present

## 2018-09-25 DIAGNOSIS — E785 Hyperlipidemia, unspecified: Secondary | ICD-10-CM | POA: Diagnosis not present

## 2018-09-25 DIAGNOSIS — Z79899 Other long term (current) drug therapy: Secondary | ICD-10-CM | POA: Diagnosis not present

## 2018-09-25 DIAGNOSIS — C259 Malignant neoplasm of pancreas, unspecified: Secondary | ICD-10-CM | POA: Diagnosis not present

## 2018-09-25 DIAGNOSIS — R531 Weakness: Secondary | ICD-10-CM | POA: Diagnosis not present

## 2018-09-25 DIAGNOSIS — D649 Anemia, unspecified: Secondary | ICD-10-CM | POA: Diagnosis not present

## 2018-09-25 DIAGNOSIS — E860 Dehydration: Secondary | ICD-10-CM | POA: Diagnosis not present

## 2018-09-27 DIAGNOSIS — E46 Unspecified protein-calorie malnutrition: Secondary | ICD-10-CM | POA: Diagnosis not present

## 2018-09-27 DIAGNOSIS — I10 Essential (primary) hypertension: Secondary | ICD-10-CM | POA: Diagnosis not present

## 2018-09-27 DIAGNOSIS — I959 Hypotension, unspecified: Secondary | ICD-10-CM | POA: Diagnosis not present

## 2018-09-27 DIAGNOSIS — E119 Type 2 diabetes mellitus without complications: Secondary | ICD-10-CM | POA: Diagnosis not present

## 2018-09-27 DIAGNOSIS — R410 Disorientation, unspecified: Secondary | ICD-10-CM | POA: Diagnosis not present

## 2018-09-27 DIAGNOSIS — R55 Syncope and collapse: Secondary | ICD-10-CM | POA: Diagnosis not present

## 2018-09-27 DIAGNOSIS — C259 Malignant neoplasm of pancreas, unspecified: Secondary | ICD-10-CM | POA: Diagnosis not present

## 2018-09-27 DIAGNOSIS — D696 Thrombocytopenia, unspecified: Secondary | ICD-10-CM | POA: Diagnosis not present

## 2018-09-27 DIAGNOSIS — R404 Transient alteration of awareness: Secondary | ICD-10-CM | POA: Diagnosis not present

## 2018-09-27 DIAGNOSIS — D701 Agranulocytosis secondary to cancer chemotherapy: Secondary | ICD-10-CM | POA: Diagnosis not present

## 2018-09-27 DIAGNOSIS — D630 Anemia in neoplastic disease: Secondary | ICD-10-CM | POA: Diagnosis not present

## 2018-09-27 DIAGNOSIS — Z7982 Long term (current) use of aspirin: Secondary | ICD-10-CM | POA: Diagnosis not present

## 2018-09-27 DIAGNOSIS — E785 Hyperlipidemia, unspecified: Secondary | ICD-10-CM | POA: Diagnosis not present

## 2018-09-27 DIAGNOSIS — W182XXA Fall in (into) shower or empty bathtub, initial encounter: Secondary | ICD-10-CM | POA: Diagnosis not present

## 2018-09-27 DIAGNOSIS — J9811 Atelectasis: Secondary | ICD-10-CM | POA: Diagnosis not present

## 2018-09-27 DIAGNOSIS — Z794 Long term (current) use of insulin: Secondary | ICD-10-CM | POA: Diagnosis not present

## 2018-09-27 DIAGNOSIS — E871 Hypo-osmolality and hyponatremia: Secondary | ICD-10-CM | POA: Diagnosis not present

## 2018-09-27 DIAGNOSIS — R4020 Unspecified coma: Secondary | ICD-10-CM | POA: Diagnosis not present

## 2018-09-27 DIAGNOSIS — D709 Neutropenia, unspecified: Secondary | ICD-10-CM | POA: Diagnosis not present

## 2018-09-27 DIAGNOSIS — E860 Dehydration: Secondary | ICD-10-CM | POA: Diagnosis not present

## 2018-09-27 DIAGNOSIS — E1165 Type 2 diabetes mellitus with hyperglycemia: Secondary | ICD-10-CM | POA: Diagnosis not present

## 2018-09-28 DIAGNOSIS — C259 Malignant neoplasm of pancreas, unspecified: Secondary | ICD-10-CM | POA: Diagnosis not present

## 2018-09-28 DIAGNOSIS — D696 Thrombocytopenia, unspecified: Secondary | ICD-10-CM | POA: Diagnosis not present

## 2018-09-28 DIAGNOSIS — D701 Agranulocytosis secondary to cancer chemotherapy: Secondary | ICD-10-CM | POA: Diagnosis not present

## 2018-09-28 DIAGNOSIS — E46 Unspecified protein-calorie malnutrition: Secondary | ICD-10-CM | POA: Diagnosis not present

## 2018-09-28 DIAGNOSIS — D630 Anemia in neoplastic disease: Secondary | ICD-10-CM | POA: Diagnosis not present

## 2018-09-29 DIAGNOSIS — D630 Anemia in neoplastic disease: Secondary | ICD-10-CM | POA: Diagnosis not present

## 2018-09-29 DIAGNOSIS — C259 Malignant neoplasm of pancreas, unspecified: Secondary | ICD-10-CM | POA: Diagnosis not present

## 2018-09-29 DIAGNOSIS — D696 Thrombocytopenia, unspecified: Secondary | ICD-10-CM | POA: Diagnosis not present

## 2018-09-29 DIAGNOSIS — D701 Agranulocytosis secondary to cancer chemotherapy: Secondary | ICD-10-CM | POA: Diagnosis not present

## 2018-09-29 DIAGNOSIS — E46 Unspecified protein-calorie malnutrition: Secondary | ICD-10-CM | POA: Diagnosis not present

## 2018-10-01 DIAGNOSIS — C259 Malignant neoplasm of pancreas, unspecified: Secondary | ICD-10-CM | POA: Diagnosis not present

## 2018-10-01 DIAGNOSIS — D701 Agranulocytosis secondary to cancer chemotherapy: Secondary | ICD-10-CM | POA: Diagnosis not present

## 2018-10-01 DIAGNOSIS — D696 Thrombocytopenia, unspecified: Secondary | ICD-10-CM | POA: Diagnosis not present

## 2018-10-01 DIAGNOSIS — E46 Unspecified protein-calorie malnutrition: Secondary | ICD-10-CM | POA: Diagnosis not present

## 2018-10-01 DIAGNOSIS — D630 Anemia in neoplastic disease: Secondary | ICD-10-CM | POA: Diagnosis not present

## 2018-10-05 DIAGNOSIS — J189 Pneumonia, unspecified organism: Secondary | ICD-10-CM | POA: Diagnosis not present

## 2018-10-05 DIAGNOSIS — Z7982 Long term (current) use of aspirin: Secondary | ICD-10-CM | POA: Diagnosis not present

## 2018-10-05 DIAGNOSIS — T451X5A Adverse effect of antineoplastic and immunosuppressive drugs, initial encounter: Secondary | ICD-10-CM | POA: Diagnosis not present

## 2018-10-05 DIAGNOSIS — I10 Essential (primary) hypertension: Secondary | ICD-10-CM | POA: Diagnosis not present

## 2018-10-05 DIAGNOSIS — R918 Other nonspecific abnormal finding of lung field: Secondary | ICD-10-CM | POA: Diagnosis not present

## 2018-10-05 DIAGNOSIS — Z09 Encounter for follow-up examination after completed treatment for conditions other than malignant neoplasm: Secondary | ICD-10-CM | POA: Diagnosis not present

## 2018-10-05 DIAGNOSIS — K521 Toxic gastroenteritis and colitis: Secondary | ICD-10-CM | POA: Diagnosis not present

## 2018-10-05 DIAGNOSIS — E440 Moderate protein-calorie malnutrition: Secondary | ICD-10-CM | POA: Diagnosis not present

## 2018-10-05 DIAGNOSIS — C259 Malignant neoplasm of pancreas, unspecified: Secondary | ICD-10-CM | POA: Diagnosis not present

## 2018-10-05 DIAGNOSIS — I4581 Long QT syndrome: Secondary | ICD-10-CM | POA: Diagnosis not present

## 2018-10-05 DIAGNOSIS — Z6823 Body mass index (BMI) 23.0-23.9, adult: Secondary | ICD-10-CM | POA: Diagnosis not present

## 2018-10-05 DIAGNOSIS — Z452 Encounter for adjustment and management of vascular access device: Secondary | ICD-10-CM | POA: Diagnosis not present

## 2018-10-05 DIAGNOSIS — J181 Lobar pneumonia, unspecified organism: Secondary | ICD-10-CM | POA: Diagnosis not present

## 2018-10-05 DIAGNOSIS — C772 Secondary and unspecified malignant neoplasm of intra-abdominal lymph nodes: Secondary | ICD-10-CM | POA: Diagnosis not present

## 2018-10-05 DIAGNOSIS — Z923 Personal history of irradiation: Secondary | ICD-10-CM | POA: Diagnosis not present

## 2018-10-05 DIAGNOSIS — D649 Anemia, unspecified: Secondary | ICD-10-CM | POA: Diagnosis not present

## 2018-10-05 DIAGNOSIS — E119 Type 2 diabetes mellitus without complications: Secondary | ICD-10-CM | POA: Diagnosis not present

## 2018-10-05 DIAGNOSIS — Z87891 Personal history of nicotine dependence: Secondary | ICD-10-CM | POA: Diagnosis not present

## 2018-10-05 DIAGNOSIS — E860 Dehydration: Secondary | ICD-10-CM | POA: Diagnosis not present

## 2018-10-05 DIAGNOSIS — E876 Hypokalemia: Secondary | ICD-10-CM | POA: Diagnosis not present

## 2018-10-05 DIAGNOSIS — D72829 Elevated white blood cell count, unspecified: Secondary | ICD-10-CM | POA: Diagnosis not present

## 2018-10-05 DIAGNOSIS — Z794 Long term (current) use of insulin: Secondary | ICD-10-CM | POA: Diagnosis not present

## 2018-10-05 DIAGNOSIS — E785 Hyperlipidemia, unspecified: Secondary | ICD-10-CM | POA: Diagnosis not present

## 2018-10-10 ENCOUNTER — Encounter: Payer: Self-pay | Admitting: *Deleted

## 2018-10-10 ENCOUNTER — Other Ambulatory Visit: Payer: Self-pay | Admitting: *Deleted

## 2018-10-10 NOTE — Patient Outreach (Signed)
Chart review for high risk HTA pt for possible resources.  Kevin Rivas. Myrtie Neither, MSN, [facility] Gerontological Nurse Practitioner [facility] Care Management [id number]

## 2018-10-11 DIAGNOSIS — I10 Essential (primary) hypertension: Secondary | ICD-10-CM | POA: Diagnosis not present

## 2018-10-11 DIAGNOSIS — Z7982 Long term (current) use of aspirin: Secondary | ICD-10-CM | POA: Diagnosis not present

## 2018-10-11 DIAGNOSIS — E440 Moderate protein-calorie malnutrition: Secondary | ICD-10-CM | POA: Diagnosis not present

## 2018-10-11 DIAGNOSIS — E785 Hyperlipidemia, unspecified: Secondary | ICD-10-CM | POA: Diagnosis not present

## 2018-10-11 DIAGNOSIS — Z794 Long term (current) use of insulin: Secondary | ICD-10-CM | POA: Diagnosis not present

## 2018-10-11 DIAGNOSIS — C259 Malignant neoplasm of pancreas, unspecified: Secondary | ICD-10-CM | POA: Diagnosis not present

## 2018-10-11 DIAGNOSIS — J189 Pneumonia, unspecified organism: Secondary | ICD-10-CM | POA: Diagnosis not present

## 2018-10-11 DIAGNOSIS — E119 Type 2 diabetes mellitus without complications: Secondary | ICD-10-CM | POA: Diagnosis not present

## 2018-10-12 DIAGNOSIS — E119 Type 2 diabetes mellitus without complications: Secondary | ICD-10-CM | POA: Diagnosis not present

## 2018-10-12 DIAGNOSIS — Z7982 Long term (current) use of aspirin: Secondary | ICD-10-CM | POA: Diagnosis not present

## 2018-10-12 DIAGNOSIS — I10 Essential (primary) hypertension: Secondary | ICD-10-CM | POA: Diagnosis not present

## 2018-10-12 DIAGNOSIS — C259 Malignant neoplasm of pancreas, unspecified: Secondary | ICD-10-CM | POA: Diagnosis not present

## 2018-10-12 DIAGNOSIS — E440 Moderate protein-calorie malnutrition: Secondary | ICD-10-CM | POA: Diagnosis not present

## 2018-10-12 DIAGNOSIS — Z794 Long term (current) use of insulin: Secondary | ICD-10-CM | POA: Diagnosis not present

## 2018-10-12 DIAGNOSIS — E785 Hyperlipidemia, unspecified: Secondary | ICD-10-CM | POA: Diagnosis not present

## 2018-10-12 DIAGNOSIS — J189 Pneumonia, unspecified organism: Secondary | ICD-10-CM | POA: Diagnosis not present

## 2018-10-17 DIAGNOSIS — C772 Secondary and unspecified malignant neoplasm of intra-abdominal lymph nodes: Secondary | ICD-10-CM | POA: Diagnosis not present

## 2018-10-17 DIAGNOSIS — Z09 Encounter for follow-up examination after completed treatment for conditions other than malignant neoplasm: Secondary | ICD-10-CM | POA: Diagnosis not present

## 2018-10-17 DIAGNOSIS — D649 Anemia, unspecified: Secondary | ICD-10-CM | POA: Diagnosis not present

## 2018-10-17 DIAGNOSIS — R64 Cachexia: Secondary | ICD-10-CM | POA: Diagnosis not present

## 2018-10-17 DIAGNOSIS — C259 Malignant neoplasm of pancreas, unspecified: Secondary | ICD-10-CM | POA: Diagnosis not present

## 2018-10-19 DIAGNOSIS — C259 Malignant neoplasm of pancreas, unspecified: Secondary | ICD-10-CM | POA: Diagnosis not present

## 2018-10-19 DIAGNOSIS — C772 Secondary and unspecified malignant neoplasm of intra-abdominal lymph nodes: Secondary | ICD-10-CM | POA: Diagnosis not present

## 2018-10-19 DIAGNOSIS — K8689 Other specified diseases of pancreas: Secondary | ICD-10-CM | POA: Diagnosis not present

## 2018-10-30 DIAGNOSIS — E440 Moderate protein-calorie malnutrition: Secondary | ICD-10-CM | POA: Diagnosis not present

## 2018-10-30 DIAGNOSIS — I10 Essential (primary) hypertension: Secondary | ICD-10-CM | POA: Diagnosis not present

## 2018-10-30 DIAGNOSIS — C259 Malignant neoplasm of pancreas, unspecified: Secondary | ICD-10-CM | POA: Diagnosis not present

## 2018-10-30 DIAGNOSIS — E785 Hyperlipidemia, unspecified: Secondary | ICD-10-CM | POA: Diagnosis not present

## 2018-10-30 DIAGNOSIS — J189 Pneumonia, unspecified organism: Secondary | ICD-10-CM | POA: Diagnosis not present

## 2018-10-30 DIAGNOSIS — E119 Type 2 diabetes mellitus without complications: Secondary | ICD-10-CM | POA: Diagnosis not present

## 2018-10-30 DIAGNOSIS — Z794 Long term (current) use of insulin: Secondary | ICD-10-CM | POA: Diagnosis not present

## 2018-10-30 DIAGNOSIS — Z7982 Long term (current) use of aspirin: Secondary | ICD-10-CM | POA: Diagnosis not present

## 2018-10-31 DIAGNOSIS — E860 Dehydration: Secondary | ICD-10-CM | POA: Diagnosis not present

## 2018-10-31 DIAGNOSIS — R531 Weakness: Secondary | ICD-10-CM | POA: Diagnosis not present

## 2018-11-01 DIAGNOSIS — C259 Malignant neoplasm of pancreas, unspecified: Secondary | ICD-10-CM | POA: Diagnosis not present

## 2018-11-05 DIAGNOSIS — M6281 Muscle weakness (generalized): Secondary | ICD-10-CM | POA: Diagnosis not present

## 2018-11-08 DIAGNOSIS — E119 Type 2 diabetes mellitus without complications: Secondary | ICD-10-CM | POA: Diagnosis not present

## 2018-11-08 DIAGNOSIS — Z7982 Long term (current) use of aspirin: Secondary | ICD-10-CM | POA: Diagnosis not present

## 2018-11-08 DIAGNOSIS — I10 Essential (primary) hypertension: Secondary | ICD-10-CM | POA: Diagnosis not present

## 2018-11-08 DIAGNOSIS — E440 Moderate protein-calorie malnutrition: Secondary | ICD-10-CM | POA: Diagnosis not present

## 2018-11-08 DIAGNOSIS — E785 Hyperlipidemia, unspecified: Secondary | ICD-10-CM | POA: Diagnosis not present

## 2018-11-08 DIAGNOSIS — C259 Malignant neoplasm of pancreas, unspecified: Secondary | ICD-10-CM | POA: Diagnosis not present

## 2018-11-08 DIAGNOSIS — J189 Pneumonia, unspecified organism: Secondary | ICD-10-CM | POA: Diagnosis not present

## 2018-11-08 DIAGNOSIS — Z794 Long term (current) use of insulin: Secondary | ICD-10-CM | POA: Diagnosis not present

## 2018-12-11 DIAGNOSIS — E860 Dehydration: Secondary | ICD-10-CM | POA: Diagnosis not present

## 2018-12-12 DIAGNOSIS — C259 Malignant neoplasm of pancreas, unspecified: Secondary | ICD-10-CM | POA: Diagnosis not present

## 2018-12-12 DIAGNOSIS — R17 Unspecified jaundice: Secondary | ICD-10-CM | POA: Diagnosis not present

## 2018-12-12 DIAGNOSIS — C772 Secondary and unspecified malignant neoplasm of intra-abdominal lymph nodes: Secondary | ICD-10-CM | POA: Diagnosis not present

## 2018-12-16 DIAGNOSIS — E119 Type 2 diabetes mellitus without complications: Secondary | ICD-10-CM | POA: Diagnosis not present

## 2018-12-16 DIAGNOSIS — R1110 Vomiting, unspecified: Secondary | ICD-10-CM | POA: Diagnosis not present

## 2018-12-16 DIAGNOSIS — R000 Tachycardia, unspecified: Secondary | ICD-10-CM | POA: Diagnosis not present

## 2018-12-16 DIAGNOSIS — Z923 Personal history of irradiation: Secondary | ICD-10-CM | POA: Diagnosis not present

## 2018-12-16 DIAGNOSIS — Z23 Encounter for immunization: Secondary | ICD-10-CM | POA: Diagnosis not present

## 2018-12-16 DIAGNOSIS — Z86010 Personal history of colonic polyps: Secondary | ICD-10-CM | POA: Diagnosis not present

## 2018-12-16 DIAGNOSIS — I10 Essential (primary) hypertension: Secondary | ICD-10-CM | POA: Diagnosis not present

## 2018-12-16 DIAGNOSIS — C784 Secondary malignant neoplasm of small intestine: Secondary | ICD-10-CM | POA: Diagnosis not present

## 2018-12-16 DIAGNOSIS — D649 Anemia, unspecified: Secondary | ICD-10-CM | POA: Diagnosis not present

## 2018-12-16 DIAGNOSIS — C259 Malignant neoplasm of pancreas, unspecified: Secondary | ICD-10-CM | POA: Diagnosis not present

## 2018-12-16 DIAGNOSIS — K921 Melena: Secondary | ICD-10-CM | POA: Diagnosis not present

## 2018-12-16 DIAGNOSIS — K922 Gastrointestinal hemorrhage, unspecified: Secondary | ICD-10-CM | POA: Diagnosis not present

## 2018-12-16 DIAGNOSIS — R197 Diarrhea, unspecified: Secondary | ICD-10-CM | POA: Diagnosis not present

## 2018-12-16 DIAGNOSIS — K3189 Other diseases of stomach and duodenum: Secondary | ICD-10-CM | POA: Diagnosis not present

## 2018-12-16 DIAGNOSIS — E785 Hyperlipidemia, unspecified: Secondary | ICD-10-CM | POA: Diagnosis not present

## 2018-12-16 DIAGNOSIS — C250 Malignant neoplasm of head of pancreas: Secondary | ICD-10-CM | POA: Diagnosis not present

## 2018-12-16 DIAGNOSIS — I959 Hypotension, unspecified: Secondary | ICD-10-CM | POA: Diagnosis not present

## 2018-12-16 DIAGNOSIS — Z9221 Personal history of antineoplastic chemotherapy: Secondary | ICD-10-CM | POA: Diagnosis not present

## 2018-12-16 DIAGNOSIS — Z7982 Long term (current) use of aspirin: Secondary | ICD-10-CM | POA: Diagnosis not present

## 2018-12-16 DIAGNOSIS — E43 Unspecified severe protein-calorie malnutrition: Secondary | ICD-10-CM | POA: Diagnosis not present

## 2018-12-16 DIAGNOSIS — Z794 Long term (current) use of insulin: Secondary | ICD-10-CM | POA: Diagnosis not present

## 2018-12-16 DIAGNOSIS — D630 Anemia in neoplastic disease: Secondary | ICD-10-CM | POA: Diagnosis not present

## 2018-12-16 DIAGNOSIS — E440 Moderate protein-calorie malnutrition: Secondary | ICD-10-CM | POA: Diagnosis not present

## 2018-12-16 DIAGNOSIS — Z87891 Personal history of nicotine dependence: Secondary | ICD-10-CM | POA: Diagnosis not present

## 2018-12-16 DIAGNOSIS — E871 Hypo-osmolality and hyponatremia: Secondary | ICD-10-CM | POA: Diagnosis not present

## 2018-12-16 DIAGNOSIS — E860 Dehydration: Secondary | ICD-10-CM | POA: Diagnosis not present

## 2018-12-16 DIAGNOSIS — R531 Weakness: Secondary | ICD-10-CM | POA: Diagnosis not present

## 2018-12-16 DIAGNOSIS — D62 Acute posthemorrhagic anemia: Secondary | ICD-10-CM | POA: Diagnosis not present

## 2018-12-26 DIAGNOSIS — Z87891 Personal history of nicotine dependence: Secondary | ICD-10-CM | POA: Diagnosis not present

## 2018-12-26 DIAGNOSIS — Z888 Allergy status to other drugs, medicaments and biological substances status: Secondary | ICD-10-CM | POA: Diagnosis not present

## 2018-12-26 DIAGNOSIS — Z880 Allergy status to penicillin: Secondary | ICD-10-CM | POA: Diagnosis not present

## 2018-12-26 DIAGNOSIS — I10 Essential (primary) hypertension: Secondary | ICD-10-CM | POA: Diagnosis not present

## 2018-12-26 DIAGNOSIS — D62 Acute posthemorrhagic anemia: Secondary | ICD-10-CM | POA: Diagnosis not present

## 2018-12-26 DIAGNOSIS — E871 Hypo-osmolality and hyponatremia: Secondary | ICD-10-CM | POA: Diagnosis not present

## 2018-12-26 DIAGNOSIS — D649 Anemia, unspecified: Secondary | ICD-10-CM | POA: Diagnosis not present

## 2018-12-26 DIAGNOSIS — E876 Hypokalemia: Secondary | ICD-10-CM | POA: Diagnosis not present

## 2018-12-26 DIAGNOSIS — C772 Secondary and unspecified malignant neoplasm of intra-abdominal lymph nodes: Secondary | ICD-10-CM | POA: Diagnosis not present

## 2018-12-26 DIAGNOSIS — E785 Hyperlipidemia, unspecified: Secondary | ICD-10-CM | POA: Diagnosis not present

## 2018-12-26 DIAGNOSIS — C259 Malignant neoplasm of pancreas, unspecified: Secondary | ICD-10-CM | POA: Diagnosis not present

## 2018-12-26 DIAGNOSIS — N2581 Secondary hyperparathyroidism of renal origin: Secondary | ICD-10-CM | POA: Diagnosis not present

## 2018-12-26 DIAGNOSIS — D630 Anemia in neoplastic disease: Secondary | ICD-10-CM | POA: Diagnosis not present

## 2018-12-26 DIAGNOSIS — K921 Melena: Secondary | ICD-10-CM | POA: Diagnosis not present

## 2018-12-26 DIAGNOSIS — E873 Alkalosis: Secondary | ICD-10-CM | POA: Diagnosis not present

## 2018-12-26 DIAGNOSIS — D709 Neutropenia, unspecified: Secondary | ICD-10-CM | POA: Diagnosis not present

## 2018-12-26 DIAGNOSIS — Z794 Long term (current) use of insulin: Secondary | ICD-10-CM | POA: Diagnosis not present

## 2018-12-26 DIAGNOSIS — E1165 Type 2 diabetes mellitus with hyperglycemia: Secondary | ICD-10-CM | POA: Diagnosis not present

## 2018-12-26 DIAGNOSIS — E43 Unspecified severe protein-calorie malnutrition: Secondary | ICD-10-CM | POA: Diagnosis not present

## 2018-12-26 DIAGNOSIS — K660 Peritoneal adhesions (postprocedural) (postinfection): Secondary | ICD-10-CM | POA: Diagnosis not present

## 2019-01-15 DEATH — deceased
